# Patient Record
Sex: Female | Born: 2005 | Race: White | Hispanic: No | Smoking: Never smoker
Health system: Southern US, Community
[De-identification: ages and names within clinical notes are randomized; demographics above are authoritative.]

## PROBLEM LIST (undated history)

## (undated) DIAGNOSIS — E162 Hypoglycemia, unspecified: Secondary | ICD-10-CM

## (undated) DIAGNOSIS — D649 Anemia, unspecified: Secondary | ICD-10-CM

## (undated) HISTORY — PX: OTHER SURGICAL HISTORY: SHX169

---
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
## 2023-04-02 ENCOUNTER — Other Ambulatory Visit: Payer: Self-pay

## 2023-04-02 ENCOUNTER — Emergency Department: Payer: Medicaid Other

## 2023-04-02 ENCOUNTER — Emergency Department
Admission: EM | Admit: 2023-04-02 | Discharge: 2023-04-03 | Discharge status: Against Medical Advice | Payer: Medicaid Other | Attending: Emergency Medicine | Admitting: Emergency Medicine

## 2023-04-02 DIAGNOSIS — Z3A01 Less than 8 weeks gestation of pregnancy: Secondary | ICD-10-CM | POA: Diagnosis not present

## 2023-04-02 DIAGNOSIS — Z5321 Procedure and treatment not carried out due to patient leaving prior to being seen by health care provider: Secondary | ICD-10-CM | POA: Insufficient documentation

## 2023-04-02 DIAGNOSIS — O26851 Spotting complicating pregnancy, first trimester: Secondary | ICD-10-CM | POA: Diagnosis present

## 2023-04-02 LAB — POC URINE PREG, ED: Preg Test, Ur: POSITIVE — AB

## 2023-04-02 LAB — CBC
HCT: 35.1 % — ABNORMAL LOW (ref 36.0–46.0)
Hemoglobin: 11.6 g/dL — ABNORMAL LOW (ref 12.0–15.0)
MCH: 29.4 pg (ref 26.0–34.0)
MCHC: 33 g/dL (ref 30.0–36.0)
MCV: 88.9 fL (ref 80.0–100.0)
Platelets: 299 10*3/uL (ref 150–400)
RBC: 3.95 MIL/uL (ref 3.87–5.11)
RDW: 14.1 % (ref 11.5–15.5)
WBC: 6.4 10*3/uL (ref 4.0–10.5)
nRBC: 0 % (ref 0.0–0.2)

## 2023-04-02 LAB — ABO/RH: ABO/RH(D): O POS

## 2023-04-02 LAB — HCG, QUANTITATIVE, PREGNANCY: hCG, Beta Chain, Quant, S: 2613 m[IU]/mL — ABNORMAL HIGH (ref <5)

## 2023-04-02 NOTE — ED Triage Notes (Signed)
Patient C/O vaginal bleeding that began about two hours ago. Patient states she is around [redacted] weeks pregnant. Denies cramping or passing clots at this time.

## 2023-07-09 ENCOUNTER — Other Ambulatory Visit
Admission: RE | Admit: 2023-07-09 | Discharge: 2023-07-09 | Disposition: A | Discharge status: Home / Self Care | Source: Ambulatory Visit

## 2023-07-09 DIAGNOSIS — O4690 Antepartum hemorrhage, unspecified, unspecified trimester: Secondary | ICD-10-CM | POA: Diagnosis present

## 2023-07-09 LAB — HCG, QUANTITATIVE, PREGNANCY: hCG, Beta Chain, Quant, S: <1 m[IU]/mL (ref <5)
